# Patient Record
Sex: Female | Born: 1990 | Race: White | Hispanic: No | Marital: Single | Smoking: Never smoker
Health system: Southern US, Community
[De-identification: ages and names within clinical notes are randomized; demographics above are authoritative.]

---

## 2012-09-04 ENCOUNTER — Emergency Department: Payer: Self-pay | Admitting: Emergency Medicine
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
## 2014-02-14 ENCOUNTER — Encounter: Payer: Self-pay | Admitting: Family Medicine

## 2014-02-14 ENCOUNTER — Ambulatory Visit (INDEPENDENT_AMBULATORY_CARE_PROVIDER_SITE_OTHER): Admitting: Family Medicine

## 2014-02-14 VITALS — BP 113/77 | Ht 67.0 in | Wt 131.0 lb

## 2014-02-14 DIAGNOSIS — H538 Other visual disturbances: Secondary | ICD-10-CM

## 2014-02-14 DIAGNOSIS — Z87820 Personal history of traumatic brain injury: Secondary | ICD-10-CM

## 2014-02-14 DIAGNOSIS — S060X0A Concussion without loss of consciousness, initial encounter: Secondary | ICD-10-CM

## 2014-02-14 NOTE — Patient Instructions (Signed)
Thank you for coming in today  1. Continue to rest from physical activity 2. Class is okay as long as you monitor your symptoms.  3. Plenty of water and nutrition 4. 8 hrs of sleep minimum  Followup 1 week

## 2014-02-15 ENCOUNTER — Encounter: Payer: Self-pay | Admitting: *Deleted

## 2014-02-15 DIAGNOSIS — Z87820 Personal history of traumatic brain injury: Secondary | ICD-10-CM | POA: Insufficient documentation
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
## 2014-02-15 NOTE — Progress Notes (Signed)
Patient ID: Marcelle Smilingatalie Trani, female   DOB: [birth year], 23 y.o.   MRN: [id number]   Pt scheduled for an appointment to check visual floaters in left eye after concussion.  Scheduled at [facility] on 02/22/13 at 8:10 am.  Pt notified of appointment info.   [facility]-  [address] [place], [place] [id number] [phone]

## 2014-02-21 ENCOUNTER — Ambulatory Visit (INDEPENDENT_AMBULATORY_CARE_PROVIDER_SITE_OTHER): Admitting: Family Medicine

## 2014-02-21 ENCOUNTER — Encounter: Payer: Self-pay | Admitting: Family Medicine

## 2014-02-21 VITALS — BP 126/76 | Ht 67.0 in | Wt 131.0 lb

## 2014-02-21 DIAGNOSIS — S060X0A Concussion without loss of consciousness, initial encounter: Secondary | ICD-10-CM

## 2014-02-21 NOTE — Patient Instructions (Signed)
Thank you for coming in today  Okay to start "return to play"  Day 1: Bike 20 minute Day 2: Jog 20-30 minute Day 3: Increased running. Sprinting, cutting, agility. No contact Day 4: Normal workout Day 5: Full sport

## 2014-02-22 NOTE — Progress Notes (Signed)
CC: Followup concussion HPI: Patient returns for followup of concussion sustained about 2 weeks ago now. She notes that she is feeling much better at this time. With the exception of some persistent left eye symptoms she is completely asymptomatic at this time. She feels like she turned the corner about 4 days ago. She is now able to complete all of her classes and home work without symptoms. She has not tried doing any physical activity. She does note that she still has spots at times in her left eye but does have an ophthalmology appointment scheduled for tomorrow.  ROS: As above in the HPI. All other systems are stable or negative.  OBJECTIVE: APPEARANCE:  Patient in no acute distress.The patient appeared well nourished and normally developed. HEENT: No scleral icterus. Conjunctiva non-injected Resp: Non labored Skin: No rash MSK:  - I did not repeat full concussion testing today as patient's exam was fully normal last week with the exception of eye tracking - With repeated accommodation patient was noted to again have poor eye tracking with a combination of the left eye   MSK US: Not performed   ASSESSMENT: #1. Concussive symptoms in a patient with history of 3 concussions, now resolved #2. Persistent left eye blurry vision/spots with poor accommodation on visual tracking   PLAN: She was encouraged to keep her appointment with ophthalmology tomorrow. I have otherwise released her to start return to play protocol. I did give her written return to play program. I explained her that if she still has problems despite her ophthalmology appointment that I would be happy to try to help her out with this with referral to vestibular ocular rehabilitation.
